# Patient Record
Sex: Male | Born: 1990 | Race: White | Hispanic: No | Marital: Single | Smoking: Never smoker
Health system: Southern US, Community
[De-identification: ages and names within clinical notes are randomized; demographics above are authoritative.]

---
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
## 2011-04-09 NOTE — Op Note (Signed)
[place]. [facility]  Patient:    MARTON, MALIZIA Visit Number: [id number] MRN: [id number]          Service Type: PED Location: PEDS [phone] Attending Physician:  Jeannetta Ellis Dictated by:   Margit Banda. Jearld Fenton, M.D. Proc. Date: 03/21/02 Admit Date:  03/28/2002   CC:         Dr. Vanita Panda.   Operative Report  PREOPERATIVE DIAGNOSIS:  Chronic tonsillitis.  POSTOPERATIVE DIAGNOSIS:  Chronic tonsillitis.  OPERATION PERFORMED:  Tonsillectomy and adenoidectomy.  SURGEON:  Margit Banda. Jearld Fenton, M.D.  ANESTHESIA:  General endotracheal.  ESTIMATED BLOOD LOSS:  Less than 5 cc.  INDICATIONS FOR PROCEDURE:  The patient is a 20 year old who has had chronic problems with repetitive tonsillitis episodes.  He has had a significant number of episodes with missed school and is very uncomfortable with the repetitive sore throats.  He wants to proceed with the tonsillectomy.  The parents were informed of the risks and benefits of the procedure including bleeding, infection, velopharyngeal insufficiency, change in the voice, chronic pain, and risks of the anesthetic.  All questions were answered  and consent was obtained.  DESCRIPTION OF PROCEDURE:  The patient was taken to the operating room and placed in supine position.  After adequate general endotracheal tube anesthesia, he was placed in a Rose position and draped in the usual sterile manner.  The Crowe-Davis mouth gag was inserted, retracted and suspended from the Mayo stand.  The palate was checked.  There was no submucous cleft and the palate was of adequate length.  The red rubber catheter was inserted and the palate was elevated.  The left tonsil began making a left anterior tonsillar pillar and incision identifying the capsule of the tonsil and removing it with electrocautery dissection.  Right tonsil removed in the same fashion.  The adenoid tissues were then examined with a mirror and removed  with a suction cautery.  It was moderate in size.  The nasopharynx was irrigated expressing clear fluid.  The Crowe-Davis was released and resuspended and there was hemostasis present in all locations.  The hypopharynx, esophagus and stomach were suctioned with an NG tube.  The patient was awakened and brought to recovery in stable condition.  Counts correct. Dictated by:   Margit Banda. Jearld Fenton, M.D. Attending Physician:  Jeannetta Ellis DD:  03/21/02 TD:  03/21/02 Job: [id number]

## 2016-08-12 ENCOUNTER — Ambulatory Visit (INDEPENDENT_AMBULATORY_CARE_PROVIDER_SITE_OTHER): Payer: BLUE CROSS/BLUE SHIELD | Admitting: Physician Assistant

## 2016-08-12 ENCOUNTER — Ambulatory Visit: Payer: Self-pay

## 2016-08-12 VITALS — BP 116/74 | HR 65 | Temp 98.9°F | Resp 16 | Ht 68.5 in | Wt 163.0 lb

## 2016-08-12 DIAGNOSIS — L255 Unspecified contact dermatitis due to plants, except food: Secondary | ICD-10-CM

## 2016-08-12 MED ORDER — PREDNISONE 20 MG PO TABS: ORAL_TABLET | ORAL | 0 refills | Status: AC

## 2016-08-12 NOTE — Progress Notes (Signed)
   Kenneth KnudsenStephen G Phelps  MRN: [id number] DOB: 10/06/1991  PCP: No primary care provider on file.  Subjective:  Pt is a 25 year old male presents to clinic for rash x 10 days. Rash began as a linear red mark along the inside of his left arm and has since spread across his chest, shoulders and right arm. Describes it as itchy. Notes some of the red bumps have drained clear fluid.  States his girlfriend now has an itchy red rash on her arm.  Both have had poison ivy in the past.   He was hiking in the woods a day before his rash presented.  Tried cream, helped a little, but the rash still spread.     Review of Systems  Respiratory: Negative.   Cardiovascular: Negative.   Gastrointestinal: Negative.   Skin: Positive for rash.    Patient Active Problem List   Diagnosis Date Noted  . ANKLE SPRAIN, RIGHT 08/21/2009    No current outpatient prescriptions on file prior to visit.   No current facility-administered medications on file prior to visit.     No Known Allergies  Objective:  BP 116/74 (BP Location: Right Arm, Patient Position: Sitting, Cuff Size: Normal)   Pulse 65   Temp 98.9 F (37.2 C)   Resp 16   Ht 5' 8.5" (1.74 m)   Wt 163 lb (73.9 kg)   SpO2 98%   BMI 24.42 kg/m   Physical Exam  Constitutional: He is oriented to person, place, and time and well-developed, well-nourished, and in no distress. No distress.  Cardiovascular: Normal rate, regular rhythm and normal heart sounds.   Pulmonary/Chest: Effort normal. No respiratory distress.  Neurological: He is alert and oriented to person, place, and time. GCS score is 15.  Skin: Skin is warm and dry. Rash noted. Rash is maculopapular and urticarial.  Urticarial maculopapular and vesicular lesions scattered across b/l arms, shoulders and chest. Linear maculopapular lesion left forearm, appears to be in healing stages. Excoriations along right medial wrist.   Psychiatric: Mood, memory, affect and judgment normal.  Vitals  reviewed.   Assessment and Plan :  1. Plant dermatitis - predniSONE (DELTASONE) 20 MG tablet; Take 3 PO QAM x7days, 2 PO QAM x7days, 1 PO QAM x7days  Dispense: 42 tablet; Refill: 0 - Supportive care: Wash bedding and clothes. Stay hydrated. Discouraged patient to itch. Encouraged patient to have girlfriend treat her exposure.    Marco CollieWhitney Bryon Parker, PA-C  [facility] 08/12/2016 5:36 PM

## 2016-08-12 NOTE — Patient Instructions (Signed)
     IF you received an x-ray today, you will receive an invoice from [facility]. Please contact  Radiology at [phone] with questions or concerns regarding your invoice.   IF you received labwork today, you will receive an invoice from [facility]. Please contact [facility] at [phone] with questions or concerns regarding your invoice.   Our billing staff will not be able to assist you with questions regarding bills from these companies.  You will be contacted with the lab results as soon as they are available. The fastest way to get your results is to activate your My Chart account. Instructions are located on the last page of this paperwork. If you have not heard from us regarding the results in 2 weeks, please contact this office.
